# Patient Record
Sex: Female | Born: 1985 | Race: White | Hispanic: No | Marital: Married | Smoking: Never smoker
Health system: Southern US, Community
[De-identification: ages and names within clinical notes are randomized; demographics above are authoritative.]

---

## 2018-11-18 ENCOUNTER — Other Ambulatory Visit: Payer: Self-pay

## 2018-11-18 ENCOUNTER — Encounter: Payer: Self-pay | Admitting: Podiatry

## 2018-11-18 ENCOUNTER — Ambulatory Visit (INDEPENDENT_AMBULATORY_CARE_PROVIDER_SITE_OTHER): Payer: Federal, State, Local not specified - PPO

## 2018-11-18 ENCOUNTER — Ambulatory Visit: Payer: Federal, State, Local not specified - PPO | Admitting: Podiatry

## 2018-11-18 VITALS — Temp 97.8°F | Resp 16

## 2018-11-18 DIAGNOSIS — M216X2 Other acquired deformities of left foot: Secondary | ICD-10-CM

## 2018-11-18 DIAGNOSIS — M722 Plantar fascial fibromatosis: Secondary | ICD-10-CM

## 2018-11-18 DIAGNOSIS — M79672 Pain in left foot: Secondary | ICD-10-CM | POA: Diagnosis not present

## 2018-11-18 DIAGNOSIS — M7732 Calcaneal spur, left foot: Secondary | ICD-10-CM

## 2018-11-18 NOTE — Progress Notes (Signed)
   Subjective:    Patient ID: Savannah Rodriguez, female    DOB: 1985-07-11, 33 y.o.   MRN: [id number]  HPI    Review of Systems  Musculoskeletal: Positive for arthralgias and myalgias.  All other systems reviewed and are negative.      Objective:   Physical Exam        Assessment & Plan:

## 2018-11-18 NOTE — Patient Instructions (Signed)

## 2018-11-18 NOTE — Progress Notes (Signed)
  Subjective:  Patient ID: Savannah Rodriguez, female    DOB: [birth year],  MRN: [id number]  Chief Complaint  Patient presents with  . Foot Pain    Lt bottom/back heel pain x 3 wks; 6/10 shapr constant pain - no injury -pt states pain started after wlaking/jogging Tx: icing and rolling foot    33 y.o. female presents with the above complaint. Hx as above.   Review of Systems: Negative except as noted in the HPI. Denies N/V/F/Ch.  No past medical history on file.  Current Outpatient Medications:  .  zonisamide (ZONEGRAN) 100 MG capsule, , Disp: , Rfl:   Social History   Tobacco Use  Smoking Status Never Smoker  Smokeless Tobacco Never Used    Not on File Objective:   Vitals:   11/18/18 1320  Resp: 16  Temp: 97.8 F (36.6 C)   There is no height or weight on file to calculate BMI. Constitutional Well developed. Well nourished.  Vascular Dorsalis pedis pulses palpable bilaterally. Posterior tibial pulses palpable bilaterally. Capillary refill normal to all digits.  No cyanosis or clubbing noted. Pedal hair growth normal.  Neurologic Normal speech. Oriented to person, place, and time. Epicritic sensation to light touch grossly present bilaterally.  Dermatologic Nails well groomed and normal in appearance. No open wounds. No skin lesions.  Orthopedic: Normal joint ROM without pain or crepitus bilaterally. No visible deformities. Tender to palpation at the calcaneal tuber left. No pain with calcaneal squeeze left. Ankle ROM diminished range of motion left. Silfverskiold Test: positive left.   Radiographs: Taken and reviewed. No acute fractures or dislocations. No evidence of stress fracture.  Plantar heel spur present. Posterior heel spur present.   Assessment:   1. Plantar fasciitis of left foot   2. Acquired equinus deformity of left foot   3. Pain of left heel   4. Heel spur, left    Plan:  Patient was evaluated and treated and all questions answered.   Plantar Fasciitis, left - XR reviewed as above.  - Educated on icing and stretching. Instructions given.  - Injection delivered to the plantar fascia as below. - DME: Night splint dispensed.  Procedure: Injection Tendon/Ligament Location: Left plantar fascia at the glabrous junction; medial approach. Skin Prep: alcohol Injectate: 1 cc 0.5% marcaine plain, 1 cc dexamethasone phosphate, 0.5 cc kenalog 10. Disposition: Patient tolerated procedure well. Injection site dressed with a band-aid.  Return in about 3 weeks (around 12/09/2018) for Plantar fasciitis.

## 2018-11-26 ENCOUNTER — Other Ambulatory Visit: Payer: Self-pay | Admitting: Podiatry

## 2018-11-26 DIAGNOSIS — M722 Plantar fascial fibromatosis: Secondary | ICD-10-CM

## 2018-11-26 DIAGNOSIS — M216X2 Other acquired deformities of left foot: Secondary | ICD-10-CM

## 2018-11-26 DIAGNOSIS — M7732 Calcaneal spur, left foot: Secondary | ICD-10-CM

## 2018-12-09 ENCOUNTER — Ambulatory Visit (INDEPENDENT_AMBULATORY_CARE_PROVIDER_SITE_OTHER): Payer: Federal, State, Local not specified - PPO | Admitting: Podiatry

## 2018-12-09 ENCOUNTER — Encounter: Payer: Self-pay | Admitting: Podiatry

## 2018-12-09 ENCOUNTER — Other Ambulatory Visit: Payer: Self-pay

## 2018-12-09 DIAGNOSIS — M722 Plantar fascial fibromatosis: Secondary | ICD-10-CM | POA: Diagnosis not present

## 2018-12-09 NOTE — Progress Notes (Signed)
  Subjective:  Patient ID: Savannah Rodriguez, female    DOB: [birth year],  MRN: [id number]  Chief Complaint  Patient presents with  . Plantar Fasciitis    F/U Lt PF Pt. states," at the start of the day the pain is better, but ath the edn of the day it's bad; 5/10 throbbign-stabbing pain." tx: icing, stretching and NS -pt denies N/V/F?Ch     33 y.o. female presents with the above complaint. Hx as above. Pain doing much better. Injection lasted for about a week.  Review of Systems: Negative except as noted in the HPI. Denies N/V/F/Ch.  No past medical history on file.  Current Outpatient Medications:  .  zonisamide (ZONEGRAN) 100 MG capsule, , Disp: , Rfl:   Social History   Tobacco Use  Smoking Status Never Smoker  Smokeless Tobacco Never Used    Not on File Objective:   Vitals:   12/09/18 1323  Resp: 16  Temp: 98.6 F (37 C)   There is no height or weight on file to calculate BMI. Constitutional Well developed. Well nourished.  Vascular Dorsalis pedis pulses palpable bilaterally. Posterior tibial pulses palpable bilaterally. Capillary refill normal to all digits.  No cyanosis or clubbing noted. Pedal hair growth normal.  Neurologic Normal speech. Oriented to person, place, and time. Epicritic sensation to light touch grossly present bilaterally.  Dermatologic Nails well groomed and normal in appearance. No open wounds. No skin lesions.  Orthopedic: Slight POP left medial calc tuber, improved since last visit.   Radiographs: None  Assessment:   1. Plantar fasciitis    Plan:  Patient was evaluated and treated and all questions answered.  Plantar Fasciitis, left -Improving -Continue stretching and icing -No injection today -Dispense plantar fascial brace to rest the fascia.  Return in about 4 weeks (around 01/06/2019) for Plantar fasciitis, Left.

## 2019-01-13 ENCOUNTER — Ambulatory Visit: Payer: Federal, State, Local not specified - PPO | Admitting: Podiatry

## 2019-01-13 ENCOUNTER — Other Ambulatory Visit: Payer: Self-pay

## 2019-01-13 ENCOUNTER — Encounter: Payer: Self-pay | Admitting: Podiatry

## 2019-01-13 VITALS — Temp 97.9°F | Resp 16

## 2019-01-13 DIAGNOSIS — M722 Plantar fascial fibromatosis: Secondary | ICD-10-CM | POA: Diagnosis not present

## 2019-01-13 NOTE — Progress Notes (Signed)
  Subjective:  Patient ID: Savannah Rodriguez, female    DOB: Mar 16, 1986,  MRN: [id number]  Chief Complaint  Patient presents with  . Plantar Fasciitis    F/U Rt PF Pt. states," much improved; 4/10 pain in AM but throughout the day it gets better." Tx: icing, stretching, NS and PF brace -pt dneis swelling/redness -pt denies N/V/F?Ch     33 y.o. female presents with the above complaint. Hx as above.   Review of Systems: Negative except as noted in the HPI. Denies N/V/F/Ch.  No past medical history on file.  Current Outpatient Medications:  .  ERRIN 0.35 MG tablet, , Disp: , Rfl:  .  zonisamide (ZONEGRAN) 100 MG capsule, , Disp: , Rfl:   Social History   Tobacco Use  Smoking Status Never Smoker  Smokeless Tobacco Never Used    Not on File Objective:   Vitals:   01/13/19 1324  Resp: 16  Temp: 97.9 F (36.6 C)   There is no height or weight on file to calculate BMI. Constitutional Well developed. Well nourished.  Vascular Dorsalis pedis pulses palpable bilaterally. Posterior tibial pulses palpable bilaterally. Capillary refill normal to all digits.  No cyanosis or clubbing noted. Pedal hair growth normal.  Neurologic Normal speech. Oriented to person, place, and time. Epicritic sensation to light touch grossly present bilaterally.  Dermatologic Nails well groomed and normal in appearance. No open wounds. No skin lesions.  Orthopedic: Slight POP left lateral calc tuber, improved since last visit.   Radiographs: None  Assessment:   1. Plantar fasciitis    Plan:  Patient was evaluated and treated and all questions answered.  Plantar Fasciitis, left -Improved, minimal pain today. -Discussed continued stretching and icing as needed. -F/u should pain recur.  No follow-ups on file.

## 2019-05-02 ENCOUNTER — Other Ambulatory Visit: Payer: Self-pay | Admitting: Specialist

## 2019-05-02 DIAGNOSIS — G4482 Headache associated with sexual activity: Secondary | ICD-10-CM

## 2019-05-21 ENCOUNTER — Ambulatory Visit
Admission: RE | Admit: 2019-05-21 | Discharge: 2019-05-21 | Disposition: A | Discharge status: Home / Self Care | Payer: Federal, State, Local not specified - PPO | Source: Ambulatory Visit | Attending: Specialist | Admitting: Specialist

## 2019-05-21 ENCOUNTER — Other Ambulatory Visit: Payer: Self-pay

## 2019-05-21 DIAGNOSIS — G4482 Headache associated with sexual activity: Secondary | ICD-10-CM

## 2021-10-02 IMAGING — MR MR HEAD W/O CM
10 series · 48 of 48 positions shown · non-contrast
Comparison: None.

CLINICAL DATA: Worsening headaches for 1 year.

EXAM:
MRI HEAD WITHOUT CONTRAST
TECHNIQUE: Multiplanar, multiecho pulse sequences of the brain and surrounding
structures were obtained without intravenous contrast.

[Series 2: T1 · sagittal · 5.0mm · 0.45mm/px · 2 of 21 slices shown]
[im 1/21]
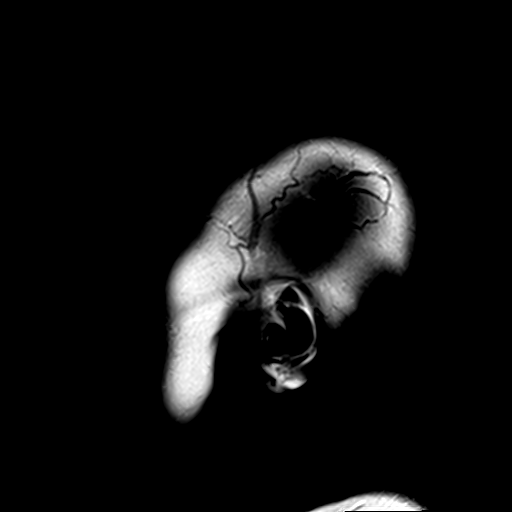
[im 21/21]
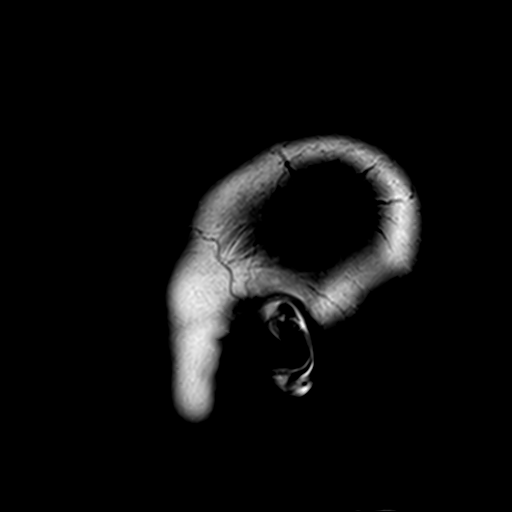

[Series 3: DWI · axial · 3.0mm · 1.80mm/px · z∈[-72,+78]mm · 9 of 104 slices shown (1 of 4)]
[im 1/104]
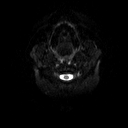
[im 13/104]
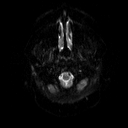
[im 26/104]
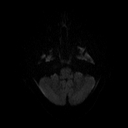
[im 39/104]
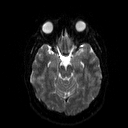
[im 52/104]
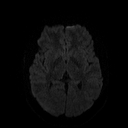
[im 65/104]
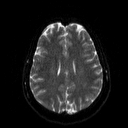
[im 78/104]
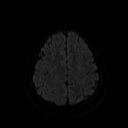
[im 91/104]
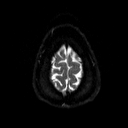
[im 104/104]
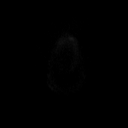

[Series 4: DWI · axial · 3.0mm · 1.80mm/px · z∈[-72,+78]mm · 5 of 52 slices shown (2 of 4)]
[im 1/52]
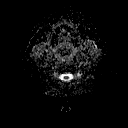
[im 13/52]
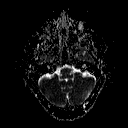
[im 26/52]
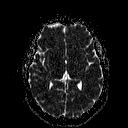
[im 39/52]
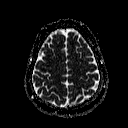
[im 52/52]
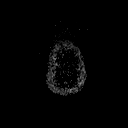

[Series 5: DWI · coronal · 5.0mm · 1.80mm/px · 6 of 72 slices shown (3 of 4)]
[im 1/72]
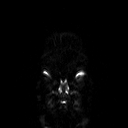
[im 15/72]
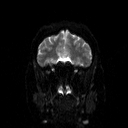
[im 29/72]
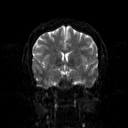
[im 43/72]
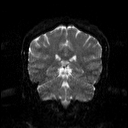
[im 57/72]
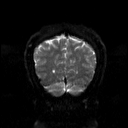
[im 72/72]
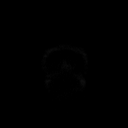

[Series 6: DWI · coronal · 5.0mm · 1.80mm/px · 3 of 36 slices shown (4 of 4)]
[im 1/36]
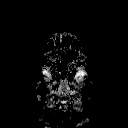
[im 18/36]
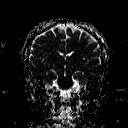
[im 36/36]
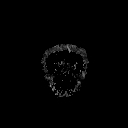

[Series 7: T2 · axial · 5.0mm · 0.51mm/px · z∈[-61,+83]mm · 2 of 22 slices shown (1 of 2)]
[im 1/22]
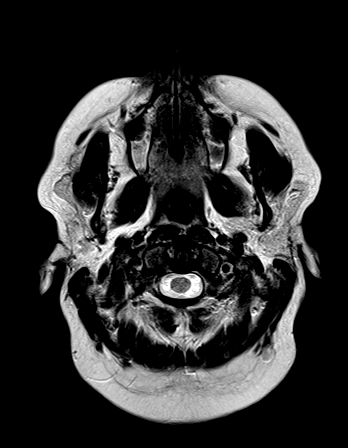
[im 22/22]
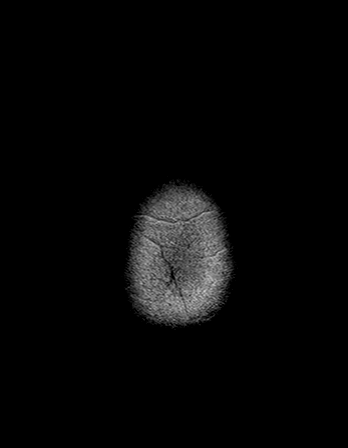

[Series 8: FLAIR · axial · 3.0mm · 0.45mm/px · z∈[-59,+82]mm · 3 of 32 slices shown]
[im 1/32]
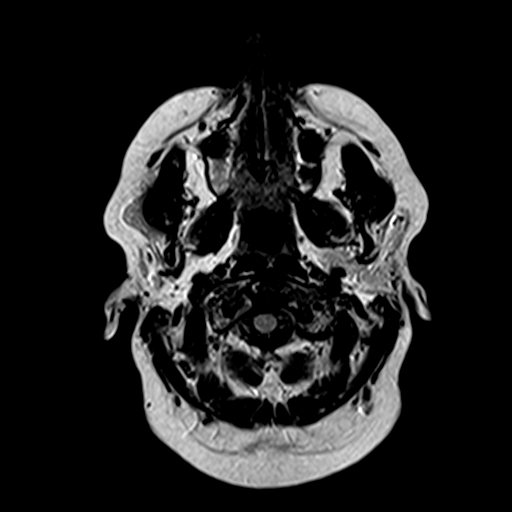
[im 16/32]
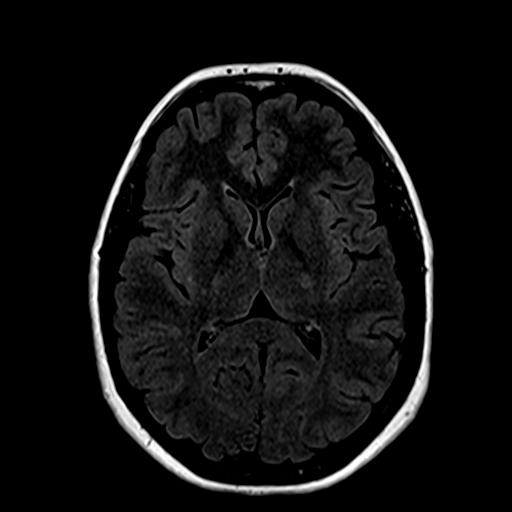
[im 32/32]
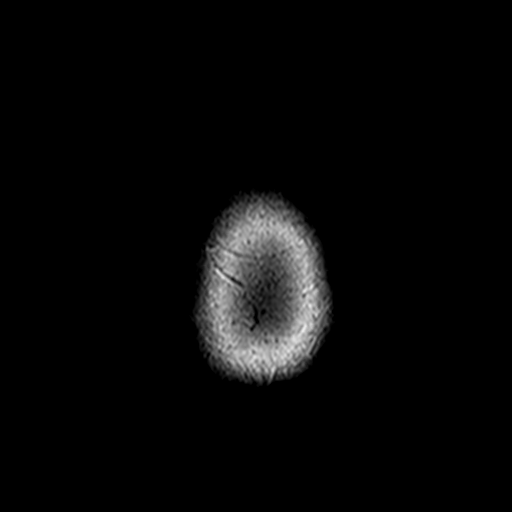

[Series 10: swi_images · axial · 4.0mm · 0.90mm/px · z∈[-65,+88]mm · 3 of 40 slices shown]
[im 1/40]
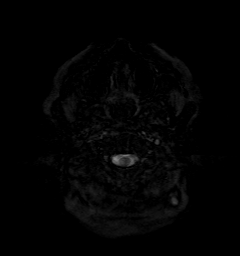
[im 20/40]
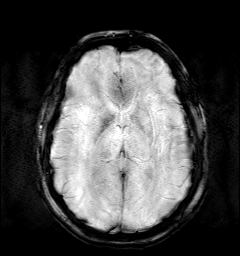
[im 40/40]
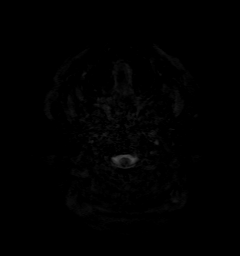

[Series 11: t1_mpr_tra · axial · 1.0mm · 0.71mm/px · z∈[-59,+81]mm · 12 of 144 slices shown]
[im 1/144]
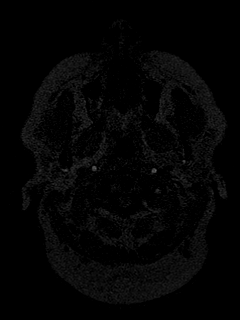
[im 14/144]
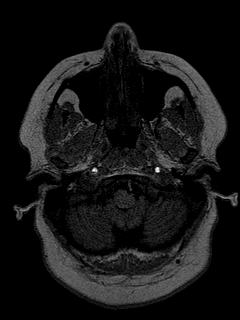
[im 27/144]
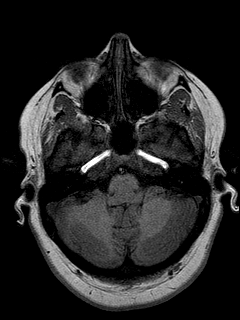
[im 40/144]
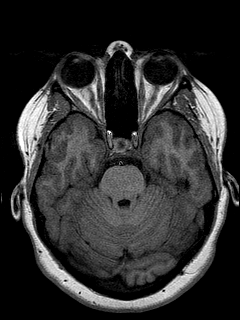
[im 53/144]
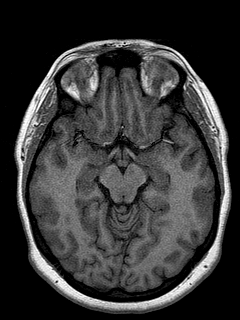
[im 66/144]
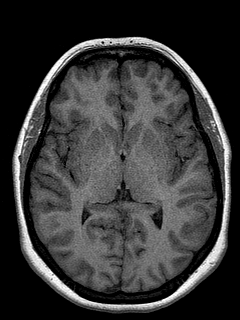
[im 79/144]
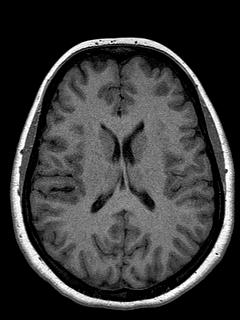
[im 92/144]
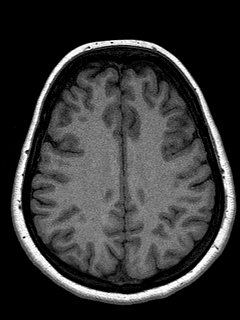
[im 105/144]
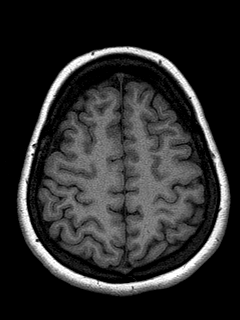
[im 118/144]
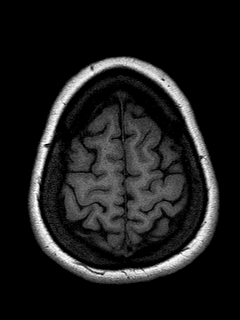
[im 131/144]
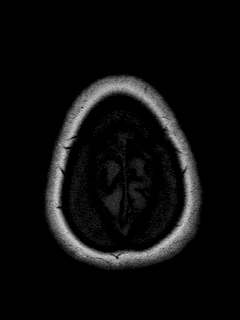
[im 144/144]
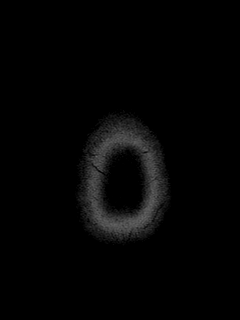

[Series 12: T2 · coronal · 5.0mm · 0.45mm/px · 3 of 30 slices shown (2 of 2)]
[im 1/30]
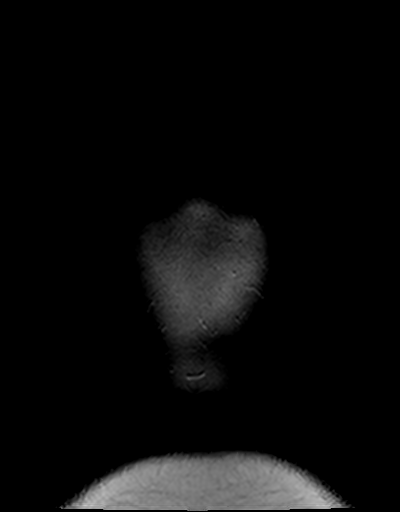
[im 15/30]
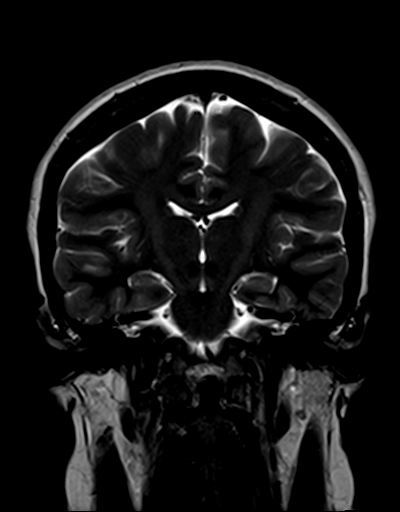
[im 30/30]
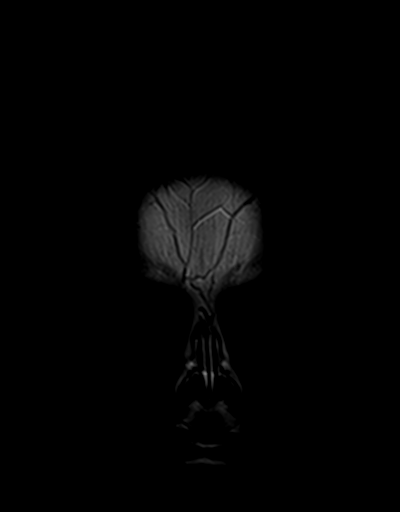

[48 of 48 positions shown; findings below may reference images not displayed]

FINDINGS: Brain: There is no evidence of acute infarct, intracranial
hemorrhage, mass, midline shift, or extra-axial fluid collection.
The ventricles and sulci are normal. The brain is normal in signal.
There is a nonexpanded partially empty sella. The cerebellar tonsils
are normally positioned.

Vascular: Major intracranial vascular flow voids are preserved.

Skull and upper cervical spine: No suspicious marrow lesion.

Sinuses/Orbits: Unremarkable orbits. Paranasal sinuses and mastoid
air cells are clear.

Other: None.
IMPRESSION: Partially empty sella, otherwise unremarkable brain MRI.
